# Patient Record
Sex: Female | Born: 1940 | Race: Asian | Hispanic: No | Marital: Married
Health system: Southern US, Community
[De-identification: ages and names within clinical notes are randomized; demographics above are authoritative.]

---

## 2008-04-01 ENCOUNTER — Inpatient Hospital Stay: Payer: Self-pay | Admitting: Specialist

## 2008-04-01 ENCOUNTER — Other Ambulatory Visit: Payer: Self-pay

## 2008-05-19 ENCOUNTER — Inpatient Hospital Stay: Payer: Self-pay | Admitting: Internal Medicine

## 2009-05-29 DEATH — deceased

## 2010-06-02 IMAGING — CR DG CHEST 1V PORT
1 series · 1 of 1 positions shown · non-contrast
Comparison: none

REASON FOR EXAM: dyspnea
COMMENTS:

PROCEDURE:     DXR - DXR PORTABLE CHEST SINGLE VIEW  - April 01, 2008  [DATE]
RESULT:     Comparison: None

[view not recorded]
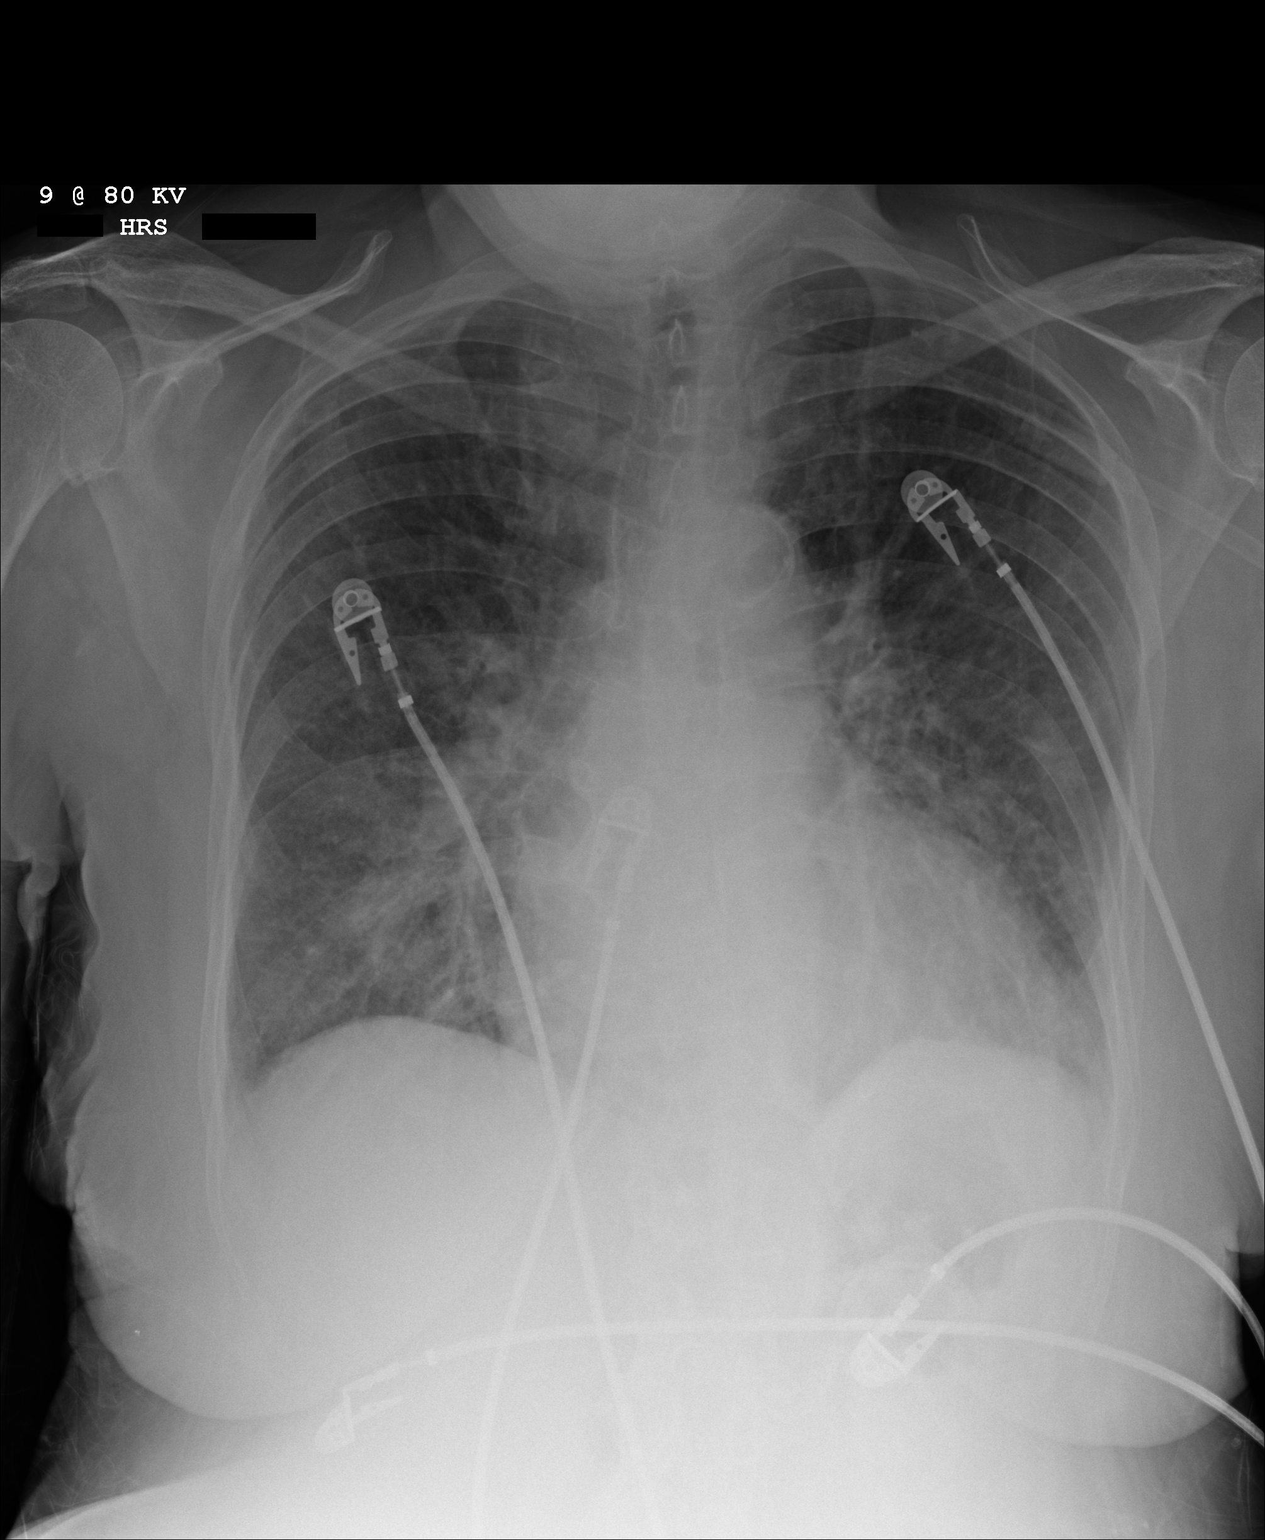

[1 of 1 positions shown; findings below may reference images not displayed]

FINDINGS: Single portable AP chest radiograph is provided. There is no focal
parenchymal opacity, pleural effusion, or pneumothorax. There are bilateral
increased interstitial markings with indistinctness of the central pulmonary
vasculature. There is cardiomegaly. The osseous structures are unremarkable.
IMPRESSION: Cardiomegaly with perihilar vascular indistinctness and increased
interstitial markings most concerning for pulmonary edema.

## 2010-07-20 IMAGING — CR DG CHEST 1V PORT
1 series · 1 of 1 positions shown · non-contrast
Comparison: none

REASON FOR EXAM: hypertensive crisis, chf, dyspnea
COMMENTS:

[view not recorded]
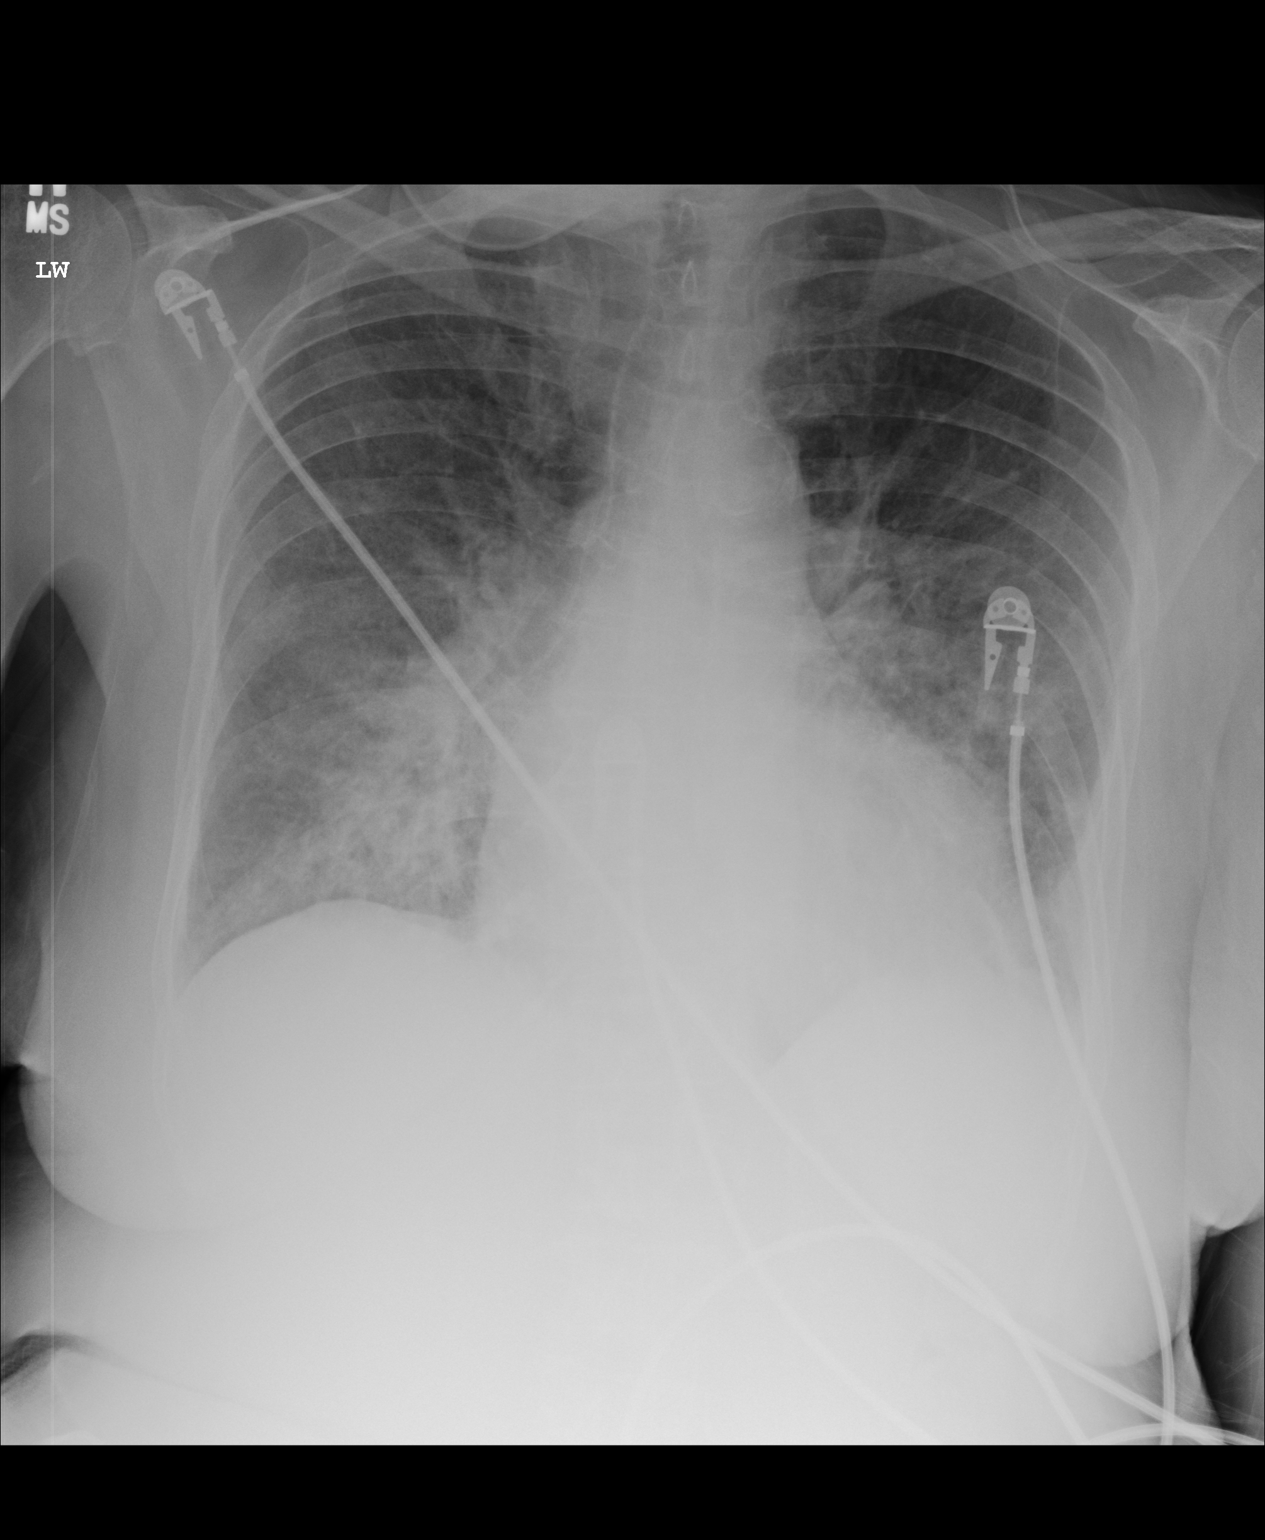

[1 of 1 positions shown; findings below may reference images not displayed]

PROCEDURE:     DXR - DXR PORTABLE CHEST SINGLE VIEW  - May 19, 2008  [DATE]

RESULT:     Comparison is made to study 01 April, 2008.

The cardiac silhouette is mildly enlarged. The pulmonary vascularity is
prominent. There are confluent alveolar infiltrates in the RIGHT infrahilar
region and increased interstitial density in the perihilar regions.
IMPRESSION: There are findings which likely reflect congestive heart
failure with pulmonary interstitial edema. I do not see evidence of a
discrete focal infiltrate but certainly such infiltrate could be obscured by
the increased lung markings in the perihilar regions. A follow-up PA and
lateral chest x-ray would be of value. When compared to the study 01 April, 2008, the pulmonary vascularity and interstitium are more
conspicuously abnormal.

## 2010-07-21 IMAGING — CR DG CHEST 2V
1 series · 2 of 2 positions shown · non-contrast
Comparison: none

REASON FOR EXAM: chf
COMMENTS:

[Series 1: view not recorded · 0.17mm/px · 2 of 2 slices shown]
[im 1/2]
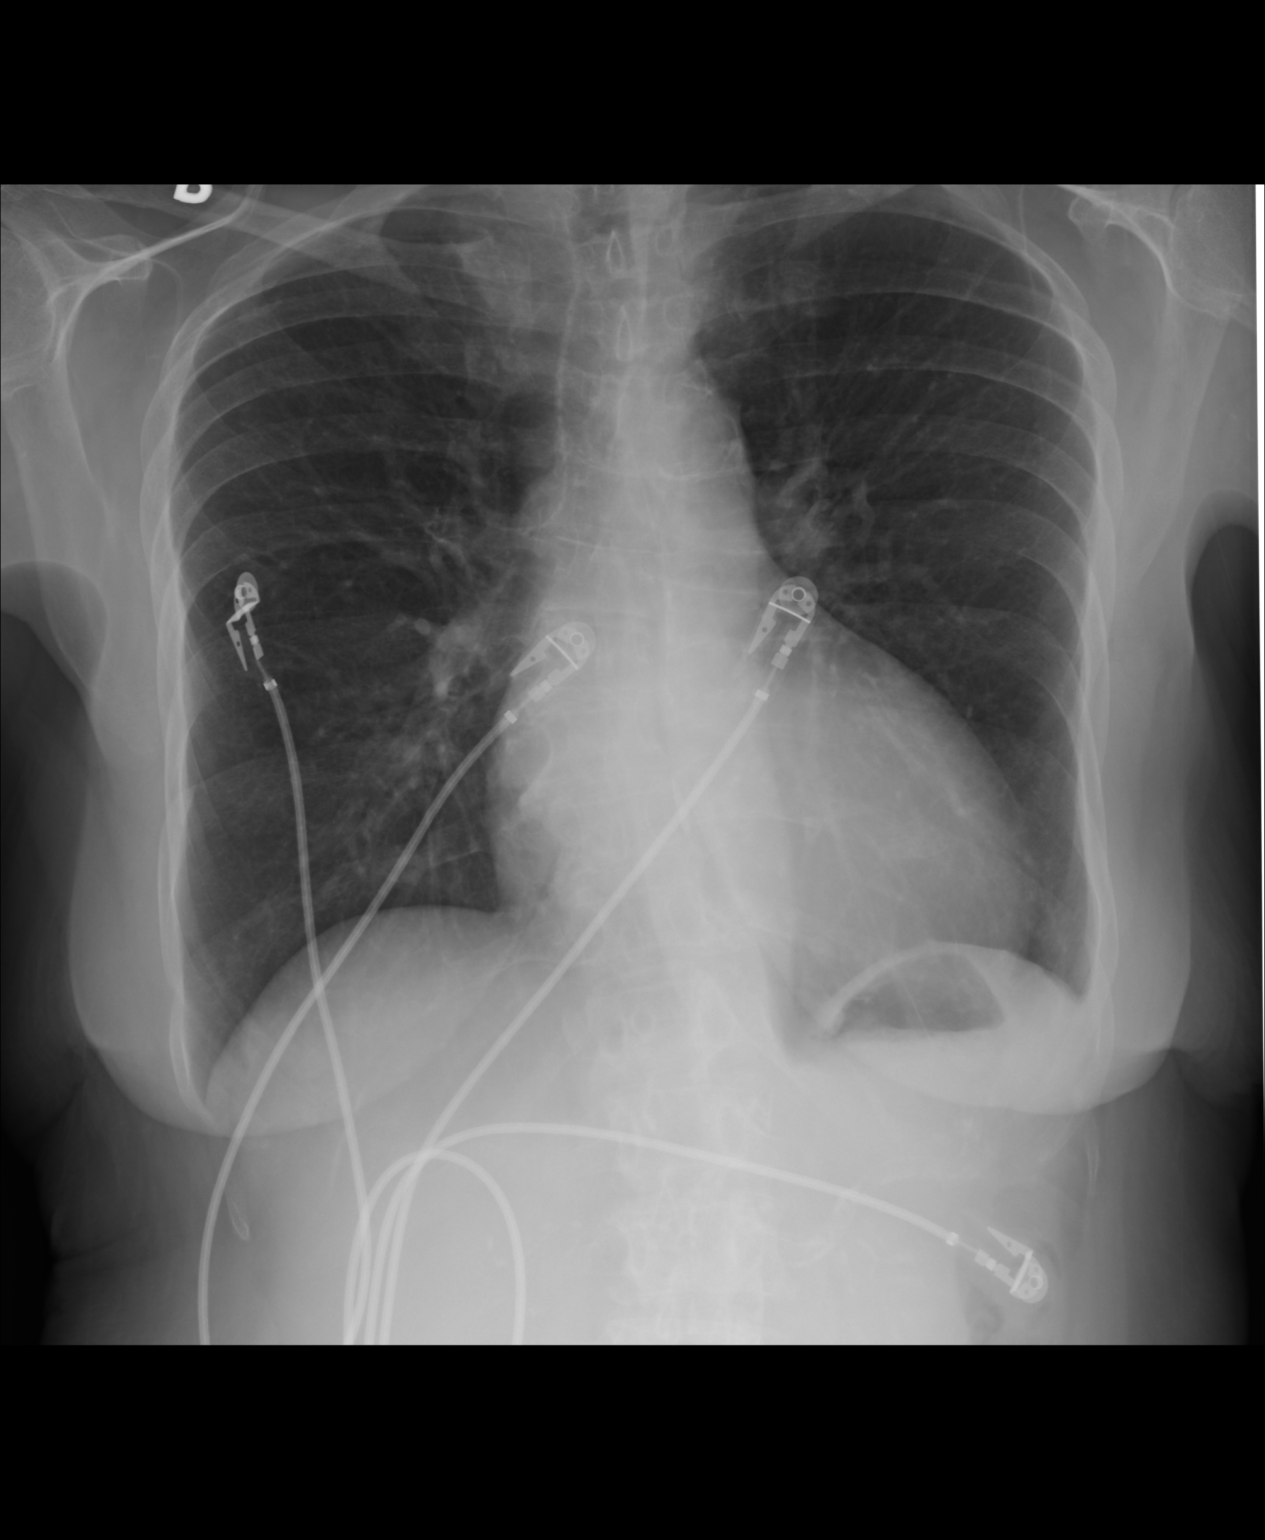
[im 2/2]
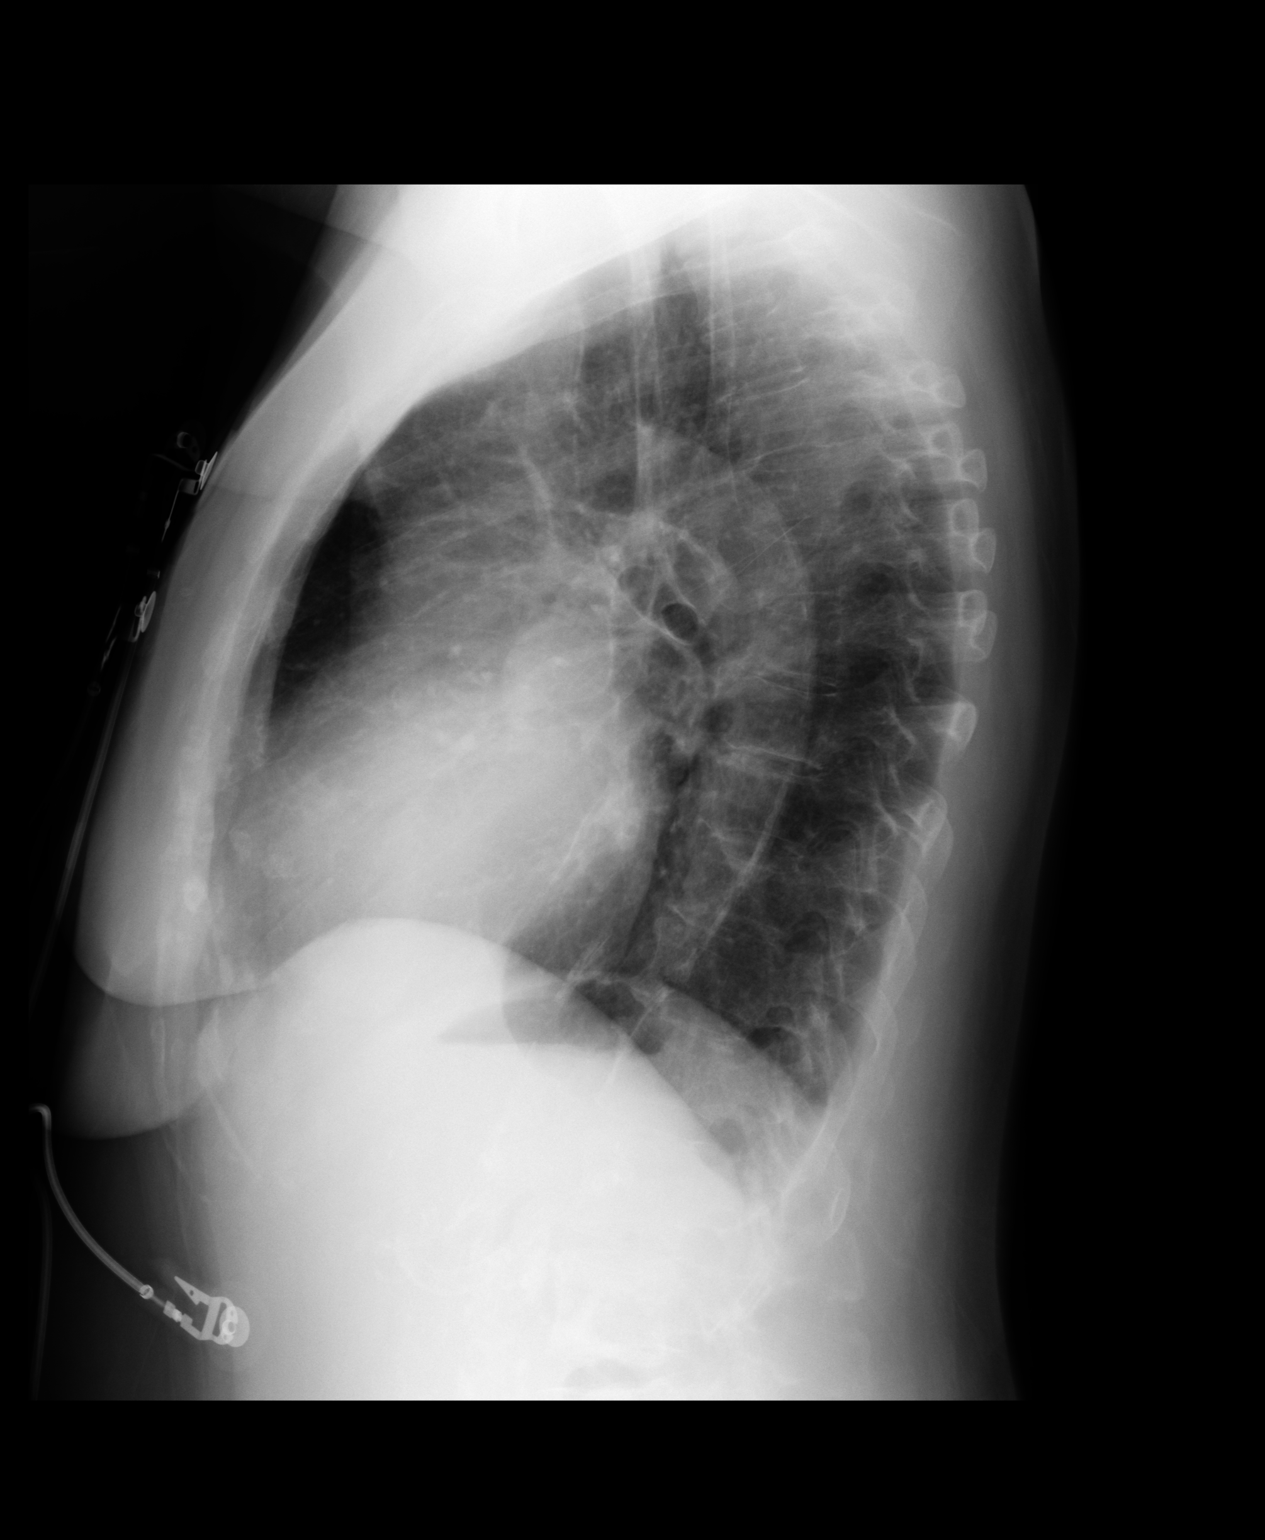

[2 of 2 positions shown; findings below may reference images not displayed]

PROCEDURE:     DXR - DXR CHEST PA (OR AP) AND LATERAL  - May 20, 2008  [DATE]

RESULT:     Comparison is made to a study 19 May, 2008.

There has been marked interval improvement in the appearance of the
pulmonary vascularity and pulmonary interstitium since yesterday's study.
The cardiac silhouette remains very mildly enlarged and there is minimal
prominence of the central pulmonary vascularity.
IMPRESSION: This has been a marked improvement in the appearance of the
lungs consistent with resolution of the majority of the interstitial edema.

## 2017-10-19 ENCOUNTER — Telehealth: Payer: Self-pay | Admitting: Family Medicine

## 2017-10-19 NOTE — Telephone Encounter (Signed)
error
